# Patient Record
Sex: Female | Born: 1980 | Race: Black or African American | Hispanic: No | Marital: Single | Smoking: Never smoker
Health system: Southern US, Community
[De-identification: ages and names within clinical notes are randomized; demographics above are authoritative.]

## PROBLEM LIST (undated history)

## (undated) DIAGNOSIS — N83202 Unspecified ovarian cyst, left side: Secondary | ICD-10-CM

## (undated) DIAGNOSIS — B379 Candidiasis, unspecified: Secondary | ICD-10-CM

## (undated) DIAGNOSIS — Z975 Presence of (intrauterine) contraceptive device: Secondary | ICD-10-CM

## (undated) HISTORY — DX: Candidiasis, unspecified: B37.9

## (undated) HISTORY — DX: Presence of (intrauterine) contraceptive device: Z97.5

## (undated) HISTORY — DX: Unspecified ovarian cyst, left side: N83.202

---

## 2000-06-12 ENCOUNTER — Emergency Department (HOSPITAL_COMMUNITY): Admission: EM | Admit: 2000-06-12 | Discharge: 2000-06-12 | Payer: Self-pay | Admitting: Emergency Medicine

## 2000-06-12 ENCOUNTER — Encounter: Payer: Self-pay | Admitting: Emergency Medicine

## 2002-08-15 ENCOUNTER — Encounter: Payer: Self-pay | Admitting: Emergency Medicine

## 2002-08-15 ENCOUNTER — Emergency Department (HOSPITAL_COMMUNITY): Admission: EM | Admit: 2002-08-15 | Discharge: 2002-08-16 | Payer: Self-pay | Admitting: Emergency Medicine

## 2007-01-13 ENCOUNTER — Inpatient Hospital Stay (HOSPITAL_COMMUNITY): Admission: AD | Admit: 2007-01-13 | Discharge: 2007-01-13 | Payer: Self-pay | Admitting: Obstetrics and Gynecology

## 2007-01-20 ENCOUNTER — Inpatient Hospital Stay (HOSPITAL_COMMUNITY): Admission: AD | Admit: 2007-01-20 | Discharge: 2007-01-23 | Payer: Self-pay | Admitting: Obstetrics and Gynecology

## 2010-06-11 NOTE — H&P (Signed)
NAMEFELIX, PRATT                ACCOUNT NO.:  [id number]   MEDICAL RECORD NO.:  [id number]          PATIENT TYPE:  INP   LOCATION:  9173                          FACILITY:  [facility]   PHYSICIAN:  Osborn Coho, M.D.   DATE OF BIRTH:  05/04/1980   DATE OF ADMISSION:  01/20/2007  DATE OF DISCHARGE:                              HISTORY & PHYSICAL   This is a 30 year old, gravida 2, para 0-0-1-0, at 58 and 0/7th weeks,  who presented earlier for evaluation of labor and possible leakage.  She  had a Nitrazine positive in the office with the rest of her exam being  negative.  The cervix was 2 cm.  She has been followed by Dr. Normand Sloop  and remarkable for:  1)  Group B Strep negative; 2)  A left hemorrhagic  cyst; 3)  fetal EIF; 4)  Abnormal Glucola with normal three-hour GTT.   ALLERGIES:  None.   OBSTETRIC HISTORY:  An elective abortion in 1997 at [redacted] weeks gestation.   MEDICAL HISTORY:  Childhood varicella.   SURGICAL HISTORY:  A 20-week abortion in 1997.   FAMILY HISTORY:  A grandmother with hypertension and heart disease, a  grandfather with prostate cancer, and a grandfather with lung cancer.   GENETIC HISTORY:  Negative.   SOCIAL HISTORY:  The patient is single.  The father of the baby is  Renae Gloss __________ , who is involved and supportive.  She is of the  Christianity faith.  She denies any alcohol, tobacco, or drug use.   PRENATAL LABORATORY DATA:  Hemoglobin 12.3, platelets 304, blood type O  positive, antibody screen negative, sickle cell negative, RPR  nonreactive, Rubella immune, hepatitis negative, HIV negative, Pap test  normal, gonorrhea negative, Chlamydia negative, cystic fibrosis  negative.   HISTORY OF CURRENT PREGNANCY:  The patient entered care at seven weeks  gestation.  She had a normal first trimester screen.  She had an anatomy  scan at 18 weeks that was normal.  She had another ultrasound at 28  weeks showing the EIF was still present; otherwise, normal.   Glucola was  elevated at 30 weeks, but three-hour GTT was normal and Group B Strep  was negative at term.   OBJECTIVE:  VITAL SIGNS:  Stable, afebrile.  HEENT:  Within normal limits.  NECK:  Thyroid not enlarged.  CHEST:  Clear to auscultation.  HEART:  Regular rate and rhythm.  ABDOMEN:  Gravid, vertex on [place].  PELVIC EXAM:  Reactive fetal heart rate with several decelerations to  120s over the last several hours.  The cervix was initially 2 cm and is  now 3 to 4, 80%, and minus one with a vertex presentation.  Dr. Su Hilt  ruptured her membranes for clear fluid and an IUPC was placed.  Ultrasound showed a BPP of 8 out of 8 with an AFI of 8.1 and a posterior  placenta.  EXTREMITIES:  Within normal limits.   ASSESSMENT:  1. Intrauterine pregnancy at 40 weeks.  2. Early labor.  3. Intermittent decelerations with overall reassuring tracing.   PLAN:  1. Admit  per Dr. Su Hilt.  2. Routine MD orders.  3. May need Pitocin augmentation.      Marie L. Williams, C.N.M.      Osborn Coho, M.D.  Electronically Signed    MLW/MEDQ  D:  01/20/2007  T:  01/20/2007  Job:  [id number]

## 2010-11-01 LAB — CBC
Hemoglobin: 11.6 — ABNORMAL LOW
Hemoglobin: 9.8 — ABNORMAL LOW
MCHC: 34.1
MCHC: 34.2
MCV: 87.2
RDW: 13.6
RDW: 13.6

## 2010-11-01 LAB — RPR: RPR Ser Ql: NONREACTIVE

## 2011-01-02 ENCOUNTER — Ambulatory Visit: Payer: Worker's Compensation

## 2011-01-07 ENCOUNTER — Ambulatory Visit: Payer: Worker's Compensation

## 2011-03-03 ENCOUNTER — Telehealth: Payer: Self-pay

## 2011-03-03 NOTE — Telephone Encounter (Signed)
Patients would like a copy of her workers comp info ov notes, rtw notes and restrictions statements faxed her employer at [phone].

## 2011-03-05 ENCOUNTER — Other Ambulatory Visit: Payer: Self-pay | Admitting: Family Medicine

## 2011-03-06 NOTE — Telephone Encounter (Signed)
PT CALLED, WAS SEEN ON 02/28/11 FOR W/C.  LOOKING FOR VICODIN AND HEADACHE MEDS PLEASE PULL PER [facility] TL.

## 2011-03-06 NOTE — Telephone Encounter (Signed)
PLEASE PULL CHART AND PUT IN REFILL REQUESTS.  ALREADY HAS A MESSAGE TO PULL.  THANKS

## 2011-03-08 ENCOUNTER — Other Ambulatory Visit: Payer: Self-pay

## 2012-02-16 ENCOUNTER — Telehealth: Payer: Self-pay | Admitting: Obstetrics and Gynecology

## 2012-02-17 ENCOUNTER — Encounter: Payer: Self-pay | Admitting: Obstetrics and Gynecology

## 2012-02-17 ENCOUNTER — Other Ambulatory Visit: Payer: Self-pay | Admitting: Obstetrics and Gynecology

## 2012-02-17 ENCOUNTER — Ambulatory Visit: Payer: Self-pay | Admitting: Obstetrics and Gynecology

## 2012-02-17 VITALS — BP 118/70 | HR 78 | Temp 99.2°F | Ht 65.0 in | Wt 175.0 lb

## 2012-02-17 DIAGNOSIS — R102 Pelvic and perineal pain unspecified side: Secondary | ICD-10-CM

## 2012-02-17 DIAGNOSIS — R350 Frequency of micturition: Secondary | ICD-10-CM

## 2012-02-17 DIAGNOSIS — IMO0001 Reserved for inherently not codable concepts without codable children: Secondary | ICD-10-CM

## 2012-02-17 DIAGNOSIS — N926 Irregular menstruation, unspecified: Secondary | ICD-10-CM

## 2012-02-17 DIAGNOSIS — Z113 Encounter for screening for infections with a predominantly sexual mode of transmission: Secondary | ICD-10-CM

## 2012-02-17 DIAGNOSIS — N898 Other specified noninflammatory disorders of vagina: Secondary | ICD-10-CM

## 2012-02-17 LAB — POCT URINALYSIS DIPSTICK
Glucose, UA: NEGATIVE
Ketones, UA: NEGATIVE
Spec Grav, UA: 1.015
Urobilinogen, UA: NEGATIVE

## 2012-02-17 LAB — POCT URINE PREGNANCY: Preg Test, Ur: NEGATIVE

## 2012-02-17 MED ORDER — FLUCONAZOLE 150 MG PO TABS: 150.0000 mg | ORAL_TABLET | Freq: Once | ORAL | Status: AC

## 2012-02-17 MED ORDER — ESTRADIOL 1 MG PO TABS: 1.5000 mg | ORAL_TABLET | Freq: Every day | ORAL | Status: AC

## 2012-02-17 MED ORDER — METRONIDAZOLE 500 MG PO TABS: 500.0000 mg | ORAL_TABLET | Freq: Three times a day (TID) | ORAL | Status: AC

## 2012-02-17 MED ORDER — NAPROXEN SODIUM 550 MG PO TABS: ORAL_TABLET | ORAL | Status: DC

## 2012-02-17 NOTE — Telephone Encounter (Signed)
Tc to pt regarding msg.  Pt informed per med list, all 4 rx's were e-prescribed and went through successfully.  Pt states she will call pharmacy back again and check, will call back if they do not have it.

## 2012-02-17 NOTE — Progress Notes (Signed)
32 YO with Implanon since 09/20/2009 complains of bleeding x 2 weeks with pad change every 1-4 hours. Admits to clots and cramps rated at 6/10 on 10 point pain scale.  Takes Ibuprofen 400 mg every 6 hours that will decrease pain to 4/10. Admits to urinary frequency, moisture in underwear (without periods) accompanied by itching and odor.  Admits to some bleeding today but states that is hardly having cramps today.   PMH-normal menses 7 days with pad change every 6-8 hours with cramps 4/10.  O: Abdomen:soft, non-tender      Pelvic: EGBUS-wnl, vagina-moderate brown discharge, cervix-non-tender, uterus-ULNS, without tenderness, adnexae-no masses      or tenderness  Wet prep: ph-5.0, whiff-positive, many clue U/A-negative UPT-negative  A: Irregular Bleeding     BV     Implanon due to expire 08/2012     Dysmenorrhea  P: GC/CT-pending       Metronidazole 500 mg #14 bid x 7 days       Diflucan 150 mg #1  1 po stat 1       Estradiol 1.5  mg  #30 1 po qd no refills       Anaprox DS  #60 bid x 3 days then prn 3 refills       RTO-as scheduled or prn  Roseann Kees, PA-C  P:

## 2012-02-17 NOTE — Progress Notes (Signed)
When did bleeding start: 02/03/12 How  Long: 2 weeks How often changing pad/tampon: first week; every 4 hours and the last couple of days had to change every 1-2 hours. Bleeding Disorders: no Cramping: yes Contraception: yes Fibroids: no Hormone Therapy: no New Medications: no Menopausal Symptoms: hot flashes Vag. Discharge: yes  White discharge Abdominal Pain: no Increased Stress: no

## 2012-02-19 LAB — GC/CHLAMYDIA PROBE AMP: GC Probe RNA: NEGATIVE

## 2012-02-20 ENCOUNTER — Telehealth: Payer: Self-pay | Admitting: Obstetrics and Gynecology

## 2012-02-20 NOTE — Telephone Encounter (Signed)
Pt called regarding Triage Message. Left message for pt to call back.    [facility] CMA

## 2012-02-20 NOTE — Telephone Encounter (Signed)
Antibiotics given to pt on 1/21 is relieving her pain.

## 2012-02-23 ENCOUNTER — Telehealth: Payer: Self-pay | Admitting: Obstetrics and Gynecology

## 2012-02-23 NOTE — Telephone Encounter (Signed)
Lm on vm to cb per telephone call.  

## 2012-02-23 NOTE — Telephone Encounter (Signed)
Tc from pt per telephone call. Pt c/o vag d/c and itching. Pt took last dose of MTZ today and took Diflucan on day 1 of ATB's. Informed pt to call pharm for another rf of Diflucan. Pt voices understanding.

## 2012-02-25 ENCOUNTER — Ambulatory Visit: Payer: Self-pay | Admitting: Obstetrics and Gynecology

## 2012-12-22 ENCOUNTER — Other Ambulatory Visit: Payer: Self-pay | Admitting: Obstetrics and Gynecology

## 2012-12-22 DIAGNOSIS — Z3046 Encounter for surveillance of implantable subdermal contraceptive: Secondary | ICD-10-CM

## 2013-01-04 ENCOUNTER — Inpatient Hospital Stay
Admission: RE | Admit: 2013-01-04 | Discharge: 2013-01-04 | Disposition: A | Discharge status: Home / Self Care | Payer: Self-pay | Source: Ambulatory Visit | Attending: Obstetrics and Gynecology | Admitting: Obstetrics and Gynecology

## 2013-01-05 ENCOUNTER — Ambulatory Visit
Admission: RE | Admit: 2013-01-05 | Discharge: 2013-01-05 | Disposition: A | Discharge status: Home / Self Care | Payer: BC Managed Care – PPO | Source: Ambulatory Visit | Attending: Obstetrics and Gynecology | Admitting: Obstetrics and Gynecology

## 2013-01-05 DIAGNOSIS — Z3046 Encounter for surveillance of implantable subdermal contraceptive: Secondary | ICD-10-CM

## 2013-11-28 ENCOUNTER — Encounter: Payer: Self-pay | Admitting: Obstetrics and Gynecology

## 2015-05-28 IMAGING — US US EXTREM UP*L* LTD
1 series · 14 of 16 positions shown · non-contrast
Comparison: None

CLINICAL DATA: Scheduled for Implanon removal and needs imaging
localization.

EXAM:
LEFT UPPER EXTREMITY LIMITED SOFT TISSUE ULTRASOUND
TECHNIQUE: Ultrasound examination of the upper extremity soft tissues was
performed in the area of clinical concern.

[Series 1: us extrem up*left* ltd · 0.07mm/px · 16 acquisitions, 14 frames shown]
[im 1/16]
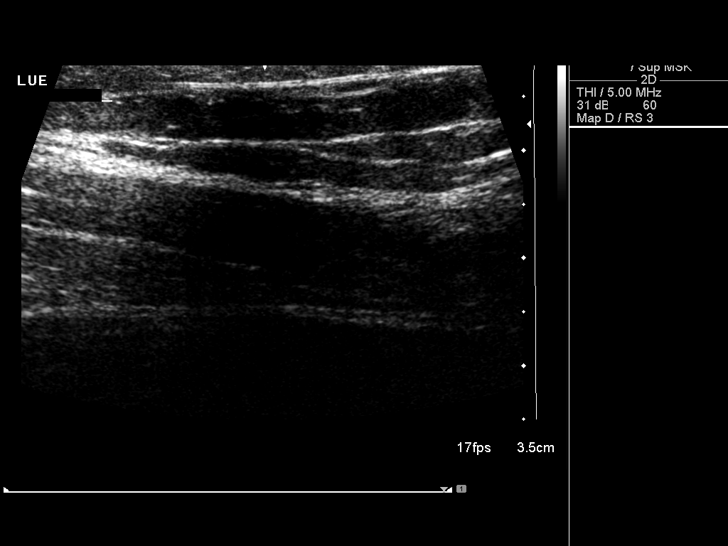
[im 2/16]
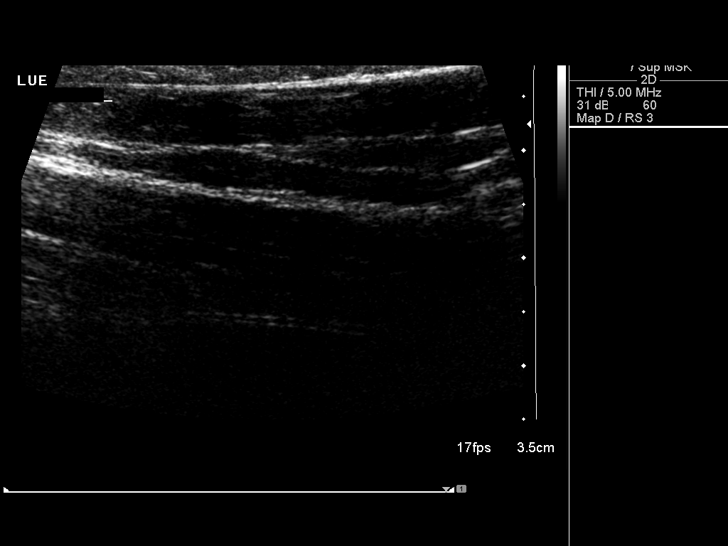
[im 3/16]
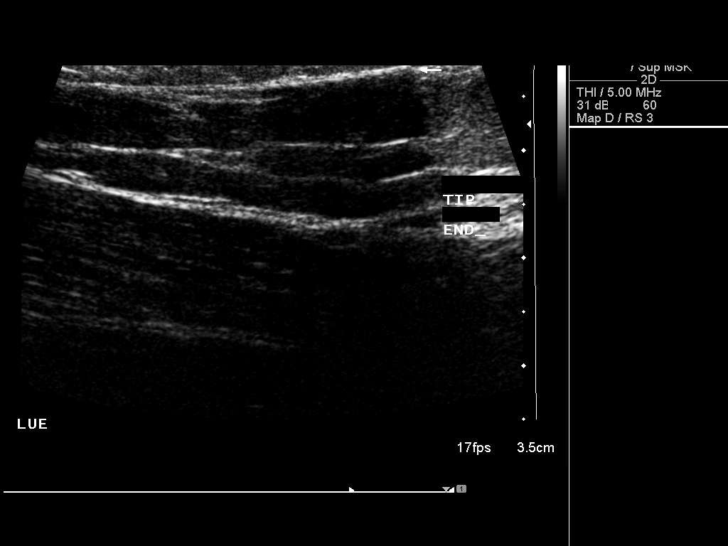
[im 5/16]
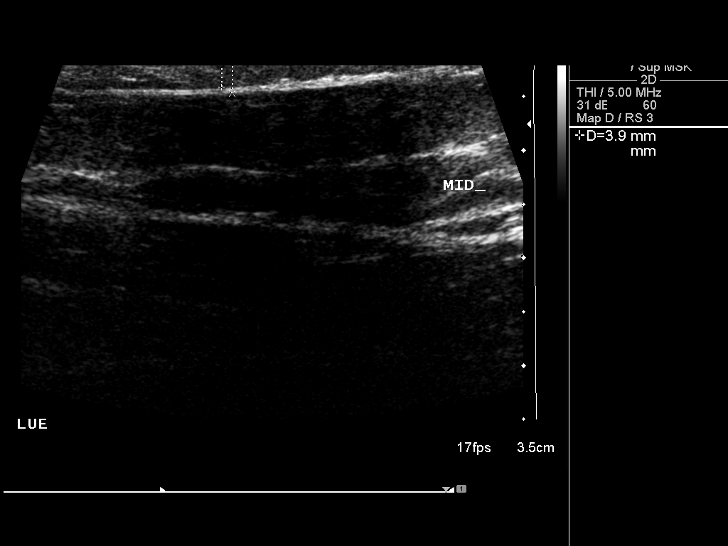
[im 6/16]
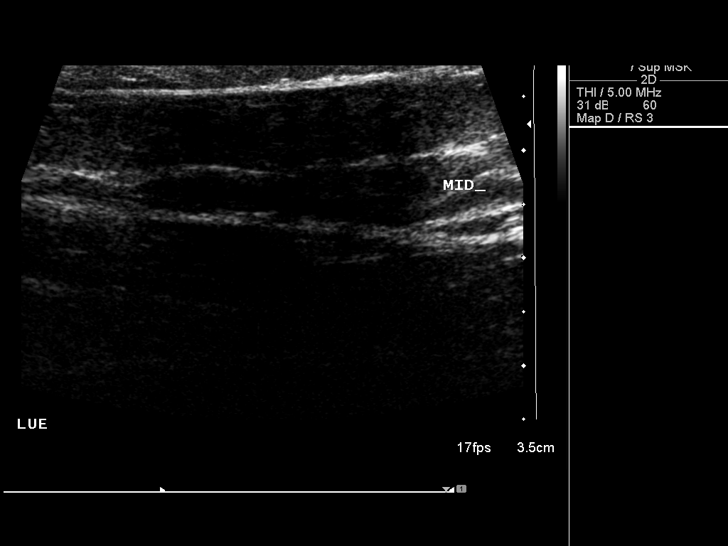
[im 7/16]
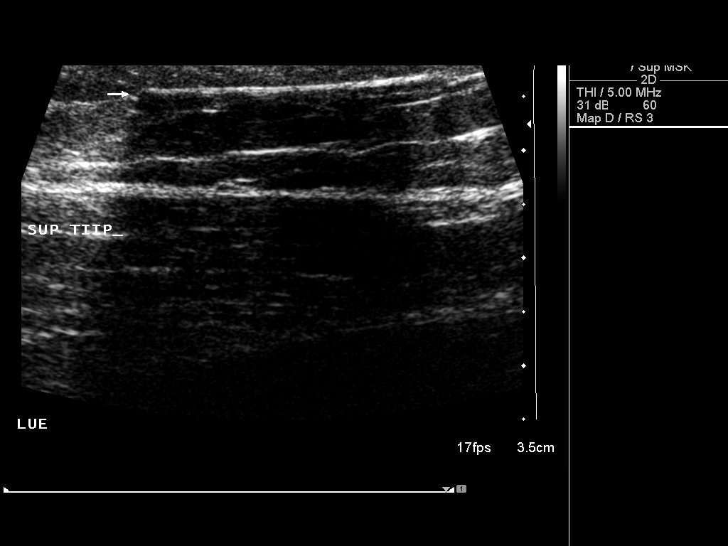
[im 8/16]
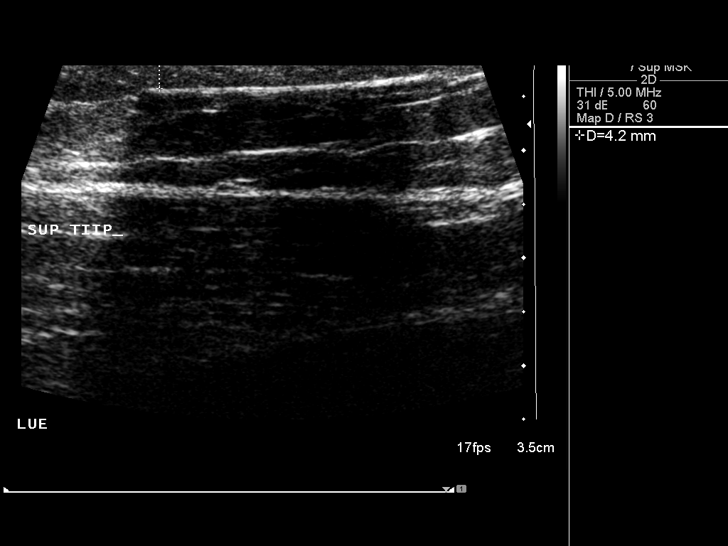
[im 9/16]
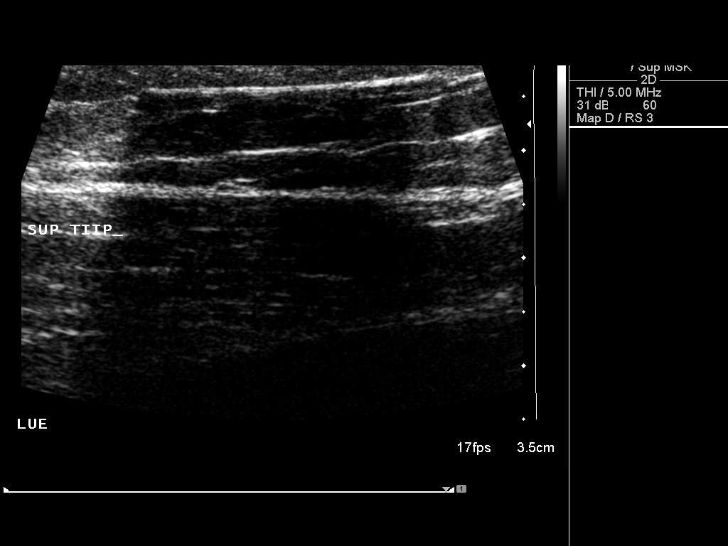
[im 10/16]
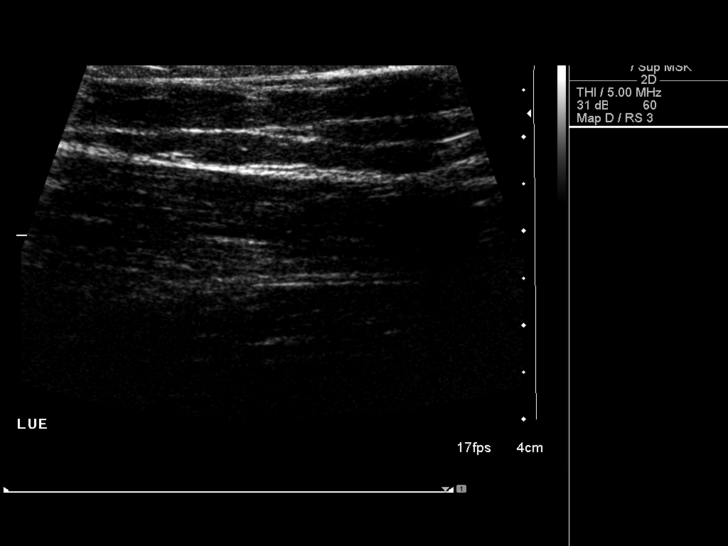
[im 11/16]
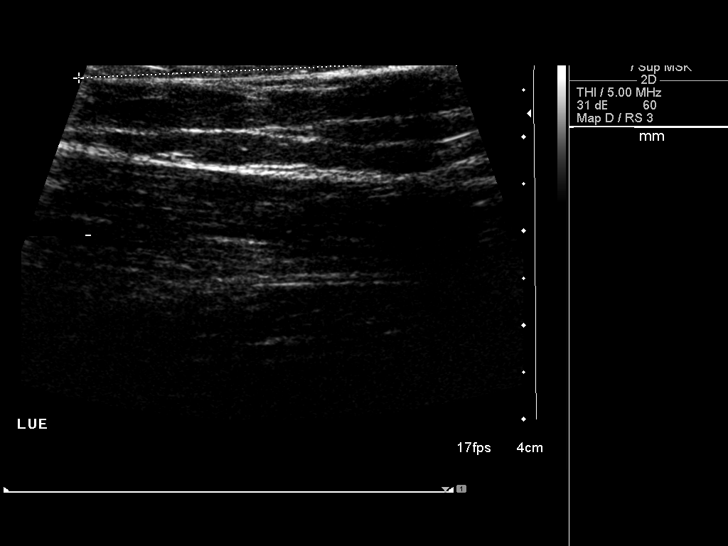
[im 13/16]
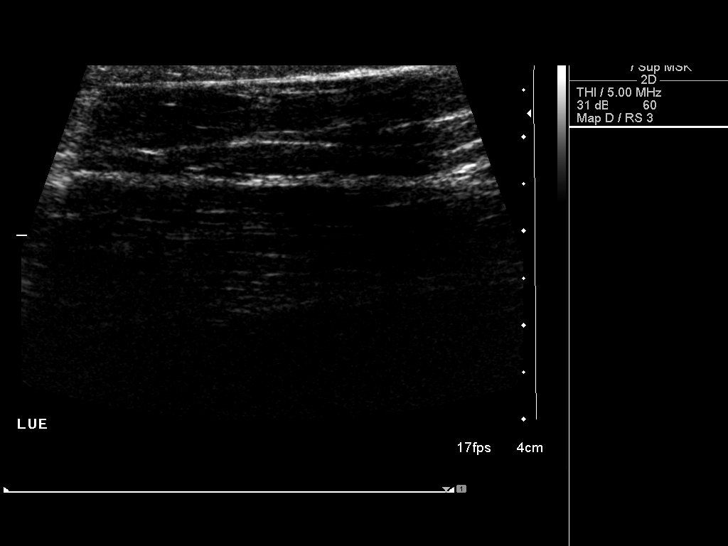
[im 14/16]
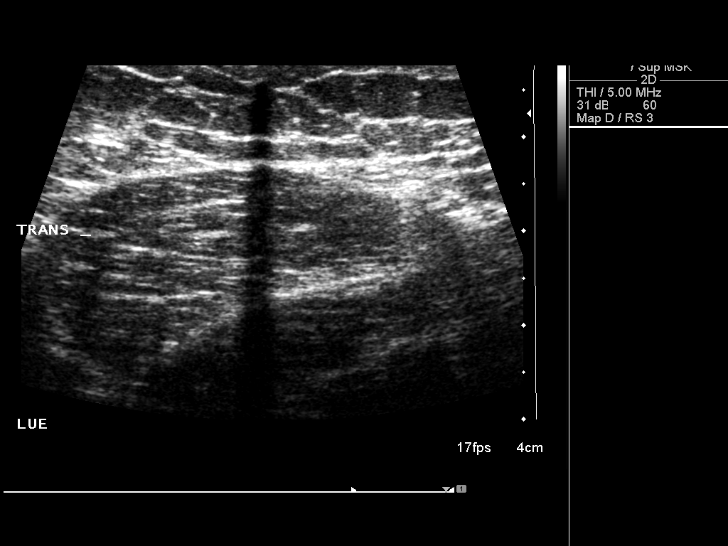
[im 15/16]
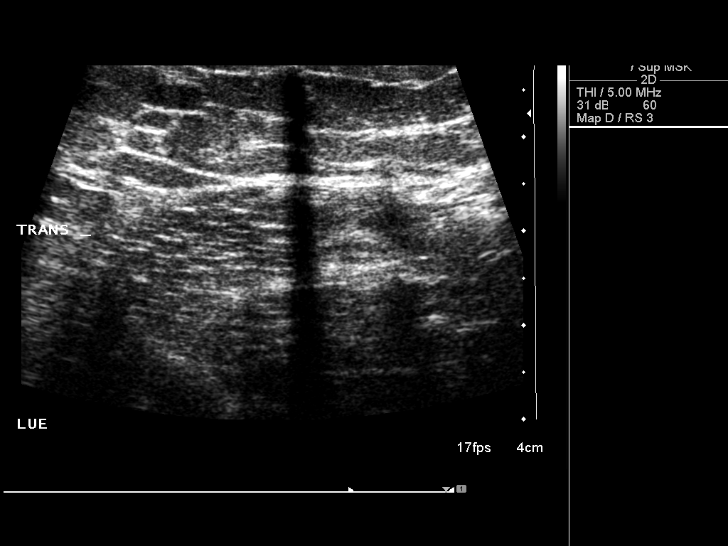
[im 16/16]
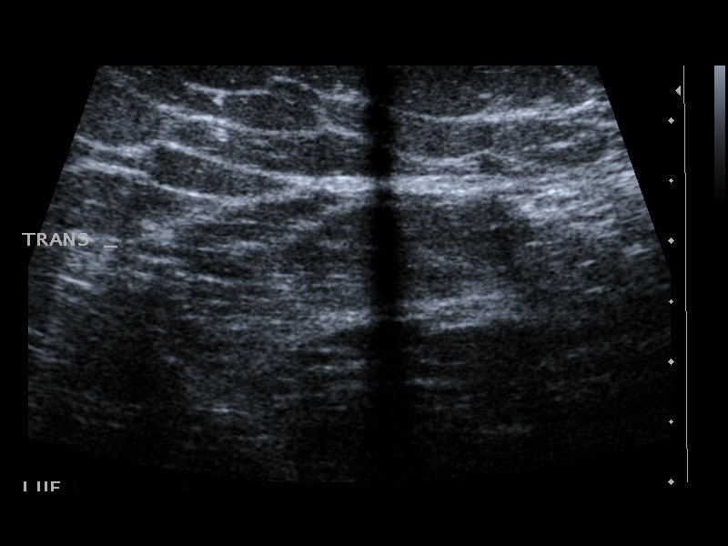

[14 of 16 positions shown; findings below may reference images not displayed]

FINDINGS: The implant is in the medial left upper arm. Implant roughly
measures 6 cm in the length on the skin surface. The inferior aspect
of the implant is 1.8 mm below the skin surface. The mid aspect of
the implant is 3.9 mm below the skin. The superior aspect of the
implant is 4.2 mm below the skin. The location of the implant was
marked on the skin.
IMPRESSION: Localization of the implant in the left upper arm.

## 2020-06-28 ENCOUNTER — Other Ambulatory Visit: Payer: Self-pay

## 2020-06-28 ENCOUNTER — Encounter: Payer: Self-pay | Admitting: Family Medicine

## 2020-06-28 ENCOUNTER — Ambulatory Visit: Payer: Self-pay

## 2020-06-28 ENCOUNTER — Ambulatory Visit: Payer: BC Managed Care – PPO | Admitting: Family Medicine

## 2020-06-28 VITALS — BP 100/60 | Ht 65.0 in | Wt 179.0 lb

## 2020-06-28 DIAGNOSIS — M25461 Effusion, right knee: Secondary | ICD-10-CM | POA: Diagnosis not present

## 2020-06-28 DIAGNOSIS — M25561 Pain in right knee: Secondary | ICD-10-CM

## 2020-06-28 MED ORDER — TRIAMCINOLONE ACETONIDE 40 MG/ML IJ SUSP
40.0000 mg | Freq: Once | INTRAMUSCULAR | Status: AC
  Administered 2020-06-28: 40 mg via INTRA_ARTICULAR

## 2020-06-28 NOTE — Progress Notes (Signed)
Katrina Steele - 40 y.o. female MRN [id number]  Date of birth: Aug 09, 1980  SUBJECTIVE:  Including CC & ROS.  No chief complaint on file.   Katrina Steele is a 40 y.o. female that is presenting with acute right knee pain.  She has had similar pain in the past.  Her knee feels swollen and pain medially.  No injury or inciting event.  Has received steroid injection a few months ago.  It only lasted for about a week.   Review of Systems See HPI   HISTORY: Past Medical, Surgical, Social, and Family History Reviewed & Updated per EMR.   Pertinent Historical Findings include:  Past Medical History:  Diagnosis Date  . Implanon in place   . Left ovarian cyst   . Yeast infection    h/o    History reviewed. No pertinent surgical history.  Family History  Problem Relation Age of Onset  . Cancer Paternal Grandfather        prostate  . Hypertension Maternal Grandmother   . Cancer Maternal Grandfather        lung  . Asthma Sister   . Hypertension Paternal Grandmother   . Heart disease Paternal Grandmother     Social History   Socioeconomic History  . Marital status: Single    Spouse name: Not on file  . Number of children: Not on file  . Years of education: Not on file  . Highest education level: Not on file  Occupational History  . Not on file  Tobacco Use  . Smoking status: Never Smoker  . Smokeless tobacco: Never Used  Substance and Sexual Activity  . Alcohol use: No  . Drug use: No  . Sexual activity: Yes    Birth control/protection: Implant    Comment: implanon;inserted 09/20/09  Other Topics Concern  . Not on file  Social History Narrative  . Not on file   Social Determinants of Health   Financial Resource Strain: Not on file  Food Insecurity: Not on file  Transportation Needs: Not on file  Physical Activity: Not on file  Stress: Not on file  Social Connections: Not on file  Intimate Partner Violence: Not on file     PHYSICAL EXAM:  VS: BP  100/60 (BP Location: Left Arm, Patient Position: Sitting, Cuff Size: Large)   Ht 5\' 5"  (1.651 m)   Wt 179 lb (81.2 kg)   BMI 29.79 kg/m  Physical Exam Gen: NAD, alert, cooperative with exam, well-appearing MSK:  Right knee: Moderate effusion. Limited range of motion. No instability. Neurovascular intact  Limited ultrasound: Right knee:  Moderate effusion with suprapatellar pouch. Normal-appearing quadricep and patellar tendon. Mild medial joint space changes and meniscus changes.  Increased hyperemia within the medial aspect. Lateral joint space with minor degenerative change lateral meniscus and increased hyperemia.  Summary: Moderate effusion and degenerative meniscal changes.  Ultrasound and interpretation by , MD   Aspiration/Injection Procedure Note Katrina Steele 01/21/1981  Procedure: Aspiration and Injection Indications: Right knee pain  Procedure Details Consent: Risks of procedure as well as the alternatives and risks of each were explained to the (patient/caregiver).  Consent for procedure obtained. Time Out: Verified patient identification, verified procedure, site/side was marked, verified correct patient position, special equipment/implants available, medications/allergies/relevent history reviewed, required imaging and test results available.  Performed.  The area was cleaned with iodine and alcohol swabs.    The right knee superior lateral suprapatellar pouch was injected using 4 cc of 1% lidocaine on a 25-gauge  1-1/2 inch needle.  An 18-gauge 1-1/2 inch needle was used to achieve aspiration.  The syringe was switched to mixture containing 1 cc's of 40 mg Kenalog and 4 cc's of 0.25% bupivacaine was injected.  Ultrasound was used. Images were obtained in long views showing the injection.    Amount of Fluid Aspirated: 92mL Character of Fluid: clear and straw colored Fluid was sent OHY:WVPX count and crystal identification  A sterile  dressing was applied.  Patient did tolerate procedure well.    ASSESSMENT & PLAN:   Effusion of bursa of right knee Has had a recurrent moderate effusion on exam.  Synovial fluid appeared normal but will evaluate.  Possibly related to underlying degenerative meniscal changes. -Counseled on home exercise therapy and supportive care. -Aspiration injection today. -Synovial fluid analysis. -Could consider further imaging or gel injection.

## 2020-06-28 NOTE — Assessment & Plan Note (Signed)
Has had a recurrent moderate effusion on exam.  Synovial fluid appeared normal but will evaluate.  Possibly related to underlying degenerative meniscal changes. -Counseled on home exercise therapy and supportive care. -Aspiration injection today. -Synovial fluid analysis. -Could consider further imaging or gel injection.

## 2020-06-28 NOTE — Patient Instructions (Signed)
Nice to meet you Please try the ice  Please try the exercise  I will call with the results   Please send me a message in MyChart with any questions or updates.  Please see me back in 4 weeks.   --Dr. Jordan Likes

## 2020-06-29 LAB — SYNOVIAL FLUID ANALYSIS, COMPLETE
Basophils, %: 0 %
Eosinophils-Synovial: 0 % (ref 0–2)
Lymphocytes-Synovial Fld: 35 % (ref 0–74)
Monocyte/Macrophage: 57 % (ref 0–69)
Neutrophil, Synovial: 8 % (ref 0–24)
Synoviocytes, %: 0 % (ref 0–15)
WBC, Synovial: 107 cells/uL (ref <150)

## 2020-07-02 ENCOUNTER — Telehealth: Payer: Self-pay | Admitting: Family Medicine

## 2020-07-02 NOTE — Telephone Encounter (Signed)
Left VM for patient. If she calls back please have her speak with a nurse/CMA and inform that his synovial fluid analysis is normal.   If any questions then please take the best time and phone number to call and I will try to call her back.   Myra Rude, MD [facility] 07/02/2020, 4:47 PM

## 2020-07-03 NOTE — Telephone Encounter (Signed)
Pt informed of below.  She still c/o swelling and feels like the fluid is slowly building back up. She wants to know what is causing the swelling. Please call her back.

## 2020-07-06 NOTE — Telephone Encounter (Signed)
Answered patient questions.   Myra Rude, MD [facility] 07/06/2020, 9:54 AM

## 2020-07-27 ENCOUNTER — Encounter: Payer: Self-pay | Admitting: Family Medicine

## 2020-07-27 ENCOUNTER — Other Ambulatory Visit: Payer: Self-pay

## 2020-07-27 ENCOUNTER — Ambulatory Visit: Payer: BC Managed Care – PPO | Admitting: Family Medicine

## 2020-07-27 VITALS — BP 94/60 | Ht 65.0 in | Wt 179.0 lb

## 2020-07-27 DIAGNOSIS — M1711 Unilateral primary osteoarthritis, right knee: Secondary | ICD-10-CM

## 2020-07-27 NOTE — Patient Instructions (Signed)
Good to see you Please use ice as needed  Please continue the exercises   Please send me a message in MyChart with any questions or updates.  We will inform you of when the gel injection is in .   --Dr. Jordan Likes

## 2020-07-27 NOTE — Assessment & Plan Note (Signed)
Has a component of degenerative changes likely playing a role. -Counseled on home exercise therapy and supportive care. -Pursue gel injection.

## 2020-07-27 NOTE — Progress Notes (Signed)
  Katrina Steele - 40 y.o. female MRN [id number]  Date of birth: 1980-07-11  SUBJECTIVE:  Including CC & ROS.  No chief complaint on file.   Katrina Steele is a 40 y.o. female that is following up for her right knee pain.  Has been doing quite well since the injection.  Has a minor twinge of pain from time to time.    Review of Systems See HPI   HISTORY: Past Medical, Surgical, Social, and Family History Reviewed & Updated per EMR.   Pertinent Historical Findings include:  Past Medical History:  Diagnosis Date   Implanon in place    Left ovarian cyst    Yeast infection    h/o    History reviewed. No pertinent surgical history.  Family History  Problem Relation Age of Onset   Cancer Paternal Grandfather        prostate   Hypertension Maternal Grandmother    Cancer Maternal Grandfather        lung   Asthma Sister    Hypertension Paternal Grandmother    Heart disease Paternal Grandmother     Social History   Socioeconomic History   Marital status: Single    Spouse name: Not on file   Number of children: Not on file   Years of education: Not on file   Highest education level: Not on file  Occupational History   Not on file  Tobacco Use   Smoking status: Never   Smokeless tobacco: Never  Substance and Sexual Activity   Alcohol use: No   Drug use: No   Sexual activity: Yes    Birth control/protection: Implant    Comment: implanon;inserted 09/20/09  Other Topics Concern   Not on file  Social History Narrative   Not on file   Social Determinants of Health   Financial Resource Strain: Not on file  Food Insecurity: Not on file  Transportation Needs: Not on file  Physical Activity: Not on file  Stress: Not on file  Social Connections: Not on file  Intimate Partner Violence: Not on file     PHYSICAL EXAM:  VS: BP 94/60 (BP Location: Left Arm, Patient Position: Sitting, Cuff Size: Normal)   Ht 5\' 5"  (1.651 m)   Wt 179 lb (81.2 kg)   BMI  29.79 kg/m  Physical Exam Gen: NAD, alert, cooperative with exam, well-appearing MSK:  Right knee: No effusion. Normal range of motion. Tenderness over the medial compartment. Neurovascular intact     ASSESSMENT & PLAN:   Primary osteoarthritis of right knee Has a component of degenerative changes likely playing a role. -Counseled on home exercise therapy and supportive care. -Pursue gel injection.

## 2020-08-15 ENCOUNTER — Telehealth: Payer: Self-pay | Admitting: *Deleted

## 2020-08-15 NOTE — Telephone Encounter (Signed)
Durolane ordered. Pt informed of below. OV scheduled 08/21/20.

## 2020-08-15 NOTE — Telephone Encounter (Signed)
Left message for patient to call back to inform pt of below:  Durolane and administration 2 are covered at 100% of allowable amount.  Deductibles do not apply to these services. Patient has a $80.0 copay whether, or not an office visit is billed. Only one copay applies per date of service. If out of pocket is met, copay will no longer apply.  Precert is approved via [place] from 08/14/20- 02/09/21.

## 2020-08-21 ENCOUNTER — Encounter: Payer: Self-pay | Admitting: Family Medicine

## 2020-08-21 ENCOUNTER — Ambulatory Visit: Payer: Self-pay

## 2020-08-21 ENCOUNTER — Other Ambulatory Visit: Payer: Self-pay

## 2020-08-21 ENCOUNTER — Ambulatory Visit: Payer: BC Managed Care – PPO | Admitting: Family Medicine

## 2020-08-21 VITALS — BP 118/80 | HR 86 | Ht 65.0 in

## 2020-08-21 DIAGNOSIS — M1711 Unilateral primary osteoarthritis, right knee: Secondary | ICD-10-CM | POA: Diagnosis not present

## 2020-08-21 NOTE — Progress Notes (Signed)
Katrina Steele - 40 y.o. female MRN [id number]  Date of birth: [birth year]  SUBJECTIVE:  Including CC & ROS.  Chief Complaint  Patient presents with   Follow-up    Right knee pain and swelling may need to drain fluid/ Left knee has also started to have aches and pain.     Katrina Steele is a 40 y.o. female that is  here for gel injection.   Review of Systems See HPI   HISTORY: Past Medical, Surgical, Social, and Family History Reviewed & Updated per EMR.   Pertinent Historical Findings include:  Past Medical History:  Diagnosis Date   Implanon in place    Left ovarian cyst    Yeast infection    h/o    History reviewed. No pertinent surgical history.  Family History  Problem Relation Age of Onset   Cancer Paternal Grandfather        prostate   Hypertension Maternal Grandmother    Cancer Maternal Grandfather        lung   Asthma Sister    Hypertension Paternal Grandmother    Heart disease Paternal Grandmother     Social History   Socioeconomic History   Marital status: Single    Spouse name: Not on file   Number of children: Not on file   Years of education: Not on file   Highest education level: Not on file  Occupational History   Not on file  Tobacco Use   Smoking status: Never   Smokeless tobacco: Never  Substance and Sexual Activity   Alcohol use: No   Drug use: No   Sexual activity: Yes    Birth control/protection: Implant    Comment: implanon;inserted 09/20/09  Other Topics Concern   Not on file  Social History Narrative   Not on file   Social Determinants of Health   Financial Resource Strain: Not on file  Food Insecurity: Not on file  Transportation Needs: Not on file  Physical Activity: Not on file  Stress: Not on file  Social Connections: Not on file  Intimate Partner Violence: Not on file     PHYSICAL EXAM:  VS: BP 118/80   Pulse 86   Ht 5\' 5"  (1.651 m)   SpO2 97%   BMI 29.79 kg/m  Physical Exam Gen: NAD, alert,  cooperative with exam, well-appearing    Aspiration/Injection Procedure Note [name] [birth year]  Procedure: Aspiration and injection  Indications: right knee pain   Procedure Details Consent: Risks of procedure as well as the alternatives and risks of each were explained to the (patient/caregiver).  Consent for procedure obtained. Time Out: Verified patient identification, verified procedure, site/side was marked, verified correct patient position, special equipment/implants available, medications/allergies/relevent history reviewed, required imaging and test results available.  Performed.  The area was cleaned with iodine and alcohol swabs.    The right knee superior lateral suprapatellar pouch was injected using 4 cc's of 1% lidocaine with a 25 1 1/2" needle.  An 18-gauge 1-1/2 inch needle was used to achieve aspiration.  The syringe was switched and a 60 mg/ 3 mL of Durolane was injected. Ultrasound was used. Images were obtained in  Long views showing the injection.    Amount of Fluid Aspirated:  35mL Character of Fluid: clear and straw colored Fluid was sent for: n/a A sterile dressing was applied.  Patient did tolerate procedure well.     ASSESSMENT & PLAN:   Primary osteoarthritis of right knee Completed Durolane injection today  -  could consider PT or further imaging.  - consider PRP or zilretta

## 2020-08-21 NOTE — Assessment & Plan Note (Signed)
Completed Durolane injection today  - could consider PT or further imaging.  - consider PRP or zilretta

## 2020-08-21 NOTE — Patient Instructions (Signed)
Good to see you Please try ice as needed   Please send me a message in MyChart with any questions or updates.  Please see me back in 4 weeks.   --Dr. Omeka Holben  

## 2020-08-22 ENCOUNTER — Telehealth: Payer: Self-pay | Admitting: Family Medicine

## 2020-08-22 NOTE — Telephone Encounter (Signed)
Counseled on her swollen knee. Will watch for now and she'll call back.   Myra Rude, MD [facility] 08/22/2020, 12:15 PM

## 2020-09-11 ENCOUNTER — Ambulatory Visit: Payer: BC Managed Care – PPO | Admitting: Family Medicine

## 2020-09-11 ENCOUNTER — Encounter: Payer: Self-pay | Admitting: Family Medicine

## 2020-09-11 ENCOUNTER — Other Ambulatory Visit: Payer: Self-pay

## 2020-09-11 VITALS — BP 122/82 | Ht 65.0 in | Wt 179.0 lb

## 2020-09-11 DIAGNOSIS — M1711 Unilateral primary osteoarthritis, right knee: Secondary | ICD-10-CM

## 2020-09-11 MED ORDER — MELOXICAM 7.5 MG PO TABS: 7.5000 mg | ORAL_TABLET | Freq: Two times a day (BID) | ORAL | 1 refills | Status: DC | PRN

## 2020-09-11 NOTE — Patient Instructions (Signed)
Good to see you Please try heat  Please try the mobic  I will call with the xray results.  Please call [phone] to schedule the MRI   Please send me a message in MyChart with any questions or updates.  We will schedule a virtual visit once the MRI is resulted.   --Dr. Jordan Likes

## 2020-09-11 NOTE — Progress Notes (Signed)
  Katrina Steele - 40 y.o. female MRN [id number]  Date of birth: 23-May-1980  SUBJECTIVE:  Including CC & ROS.  No chief complaint on file.   Katrina Steele is a 40 y.o. female that is following up for her right knee pain.  She has tried injection and gel injection and pain is still ongoing.  Pain has been present since March.  Still having pain over the medial compartment.   Review of Systems See HPI   HISTORY: Past Medical, Surgical, Social, and Family History Reviewed & Updated per EMR.   Pertinent Historical Findings include:  Past Medical History:  Diagnosis Date   Implanon in place    Left ovarian cyst    Yeast infection    h/o    History reviewed. No pertinent surgical history.  Family History  Problem Relation Age of Onset   Cancer Paternal Grandfather        prostate   Hypertension Maternal Grandmother    Cancer Maternal Grandfather        lung   Asthma Sister    Hypertension Paternal Grandmother    Heart disease Paternal Grandmother     Social History   Socioeconomic History   Marital status: Single    Spouse name: Not on file   Number of children: Not on file   Years of education: Not on file   Highest education level: Not on file  Occupational History   Not on file  Tobacco Use   Smoking status: Never   Smokeless tobacco: Never  Substance and Sexual Activity   Alcohol use: No   Drug use: No   Sexual activity: Yes    Birth control/protection: Implant    Comment: implanon;inserted 09/20/09  Other Topics Concern   Not on file  Social History Narrative   Not on file   Social Determinants of Health   Financial Resource Strain: Not on file  Food Insecurity: Not on file  Transportation Needs: Not on file  Physical Activity: Not on file  Stress: Not on file  Social Connections: Not on file  Intimate Partner Violence: Not on file     PHYSICAL EXAM:  VS: BP 122/82 (BP Location: Left Arm, Patient Position: Sitting, Cuff Size: Large)    Ht 5\' 5"  (1.651 m)   Wt 179 lb (81.2 kg)   BMI 29.79 kg/m  Physical Exam Gen: NAD, alert, cooperative with exam, well-appearing MSK:  Right knee: Mild effusion. Normal range of motion. Positive McMurray's test. Neurovascular intact     ASSESSMENT & PLAN:   Primary osteoarthritis of right knee Pain is been acute on chronic in nature.  Present before March.  Has had repeated effusions.  Unsuccessful with steroid injection and gel injection.  Has had over 6 weeks of home exercise therapy.  Previous imaging from March on x-ray was unrevealing. -Counseled on home exercise therapy and supportive care. -Mobic. -MRI to evaluate for internal derangement.

## 2020-09-12 ENCOUNTER — Encounter: Payer: Self-pay | Admitting: *Deleted

## 2020-09-12 NOTE — Assessment & Plan Note (Signed)
Pain is been acute on chronic in nature.  Present before March.  Has had repeated effusions.  Unsuccessful with steroid injection and gel injection.  Has had over 6 weeks of home exercise therapy.  Previous imaging from March on x-ray was unrevealing. -Counseled on home exercise therapy and supportive care. -Mobic. -MRI to evaluate for internal derangement.

## 2020-09-18 ENCOUNTER — Ambulatory Visit: Payer: BC Managed Care – PPO | Admitting: Family Medicine

## 2020-09-20 ENCOUNTER — Encounter: Payer: Self-pay | Admitting: Family Medicine

## 2020-09-21 ENCOUNTER — Ambulatory Visit (HOSPITAL_BASED_OUTPATIENT_CLINIC_OR_DEPARTMENT_OTHER): Payer: BC Managed Care – PPO | Attending: Family Medicine | Admitting: Physical Therapy

## 2020-09-21 ENCOUNTER — Other Ambulatory Visit: Payer: Self-pay

## 2020-09-21 ENCOUNTER — Encounter (HOSPITAL_BASED_OUTPATIENT_CLINIC_OR_DEPARTMENT_OTHER): Payer: Self-pay | Admitting: Physical Therapy

## 2020-09-21 DIAGNOSIS — R262 Difficulty in walking, not elsewhere classified: Secondary | ICD-10-CM | POA: Insufficient documentation

## 2020-09-21 DIAGNOSIS — M25661 Stiffness of right knee, not elsewhere classified: Secondary | ICD-10-CM | POA: Insufficient documentation

## 2020-09-21 DIAGNOSIS — M25561 Pain in right knee: Secondary | ICD-10-CM | POA: Diagnosis present

## 2020-09-21 DIAGNOSIS — M6281 Muscle weakness (generalized): Secondary | ICD-10-CM | POA: Diagnosis present

## 2020-09-21 NOTE — Therapy (Addendum)
[facility] [address] [place], [place], [address] Phone: [phone]   Fax:  [phone]  Physical Therapy Evaluation  Patient Details  Name: Katrina Steele MRN: [id number] Date of Birth: 1980/12/28 Referring Provider (PT): Clare Gandy, MD   Encounter Date: 09/21/2020   PT End of Session - 09/21/20 1328     Visit Number 1    Number of Visits 7    Date for PT Re-Evaluation 11/02/20    Progress Note Due on Visit --   10/19/2020   PT Start Time 1322    PT Stop Time 1420    PT Time Calculation (min) 58 min    Activity Tolerance Patient tolerated treatment well    Behavior During Therapy [facility] for tasks assessed/performed             Past Medical History:  Diagnosis Date   Implanon in place    Left ovarian cyst    Yeast infection    h/o    History reviewed. No pertinent surgical history.  There were no vitals filed for this visit.    Subjective Assessment - 09/21/20 1332     Subjective Pt reports her pain began in February and her sx's worsened in March interfering with her mobility.  Pt denies any specific MOI.  Pt saw MD and has received a cortisone shot and gel injection.  Pt reports improved sx's for 1 month from the cortisone shot and the gel injection helped some including improved swelling.  Pt has had an [place] and x ray.  pt is taking Meloxicam and reports no improvement.  Pt states she thinks she has been taking it wrong, only taking it once per day.  Pt has had knee aspirated twice.  Pt has stiffness at night.  Pt has pain with ambulation and walks with a limp.  Pt has pain at work with standing and walking  Pt has difficulty and pain with performing stairs and performs 1 step at a time.  MRI scheduled on 10/09/20.  pt has pain lying flat and difficulty sleeping.    Limitations Walking;Standing    How long can you stand comfortably? 5 mins    How long can you walk comfortably? 10-15 mins    Diagnostic  tests X ray:  small jt effusion, mild medial and patellofemoral compartment deg changes.  [place]:  (limited)  mild medial jt space changes and meniscus changes.  lateral jt space with minor deg changes lateral meniscus.  moderate effuction with suprapatellar pouch.    Patient Stated Goals to improve pain.  return to walking program    Currently in Pain? Yes    Pain Score 5    Worst: 7/10  Best:  0/10   Aggravating Factors  standing, walking, stairs    Pain Relieving Factors rest, heat, heated pool                [facility] PT Assessment - 09/21/20 0001       Assessment   Medical Diagnosis Primary OA of R knee    Referring Provider (PT) Clare Gandy, MD    Onset Date/Surgical Date --   Feb-March 2022   Hand Dominance Right    Next MD Visit --   virtual visit after MRI   Prior Therapy none for knee.      Precautions   Precautions None      Restrictions   Weight Bearing Restrictions No      Balance Screen   Has  the patient fallen in the past 6 months No      Home Environment   Living Environment Private residence    Additional Comments 2 story home      Prior Function   Level of Independence Independent   Pt was able to perform her normal functional mobility skills and work activities without increased knee pain.   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      Cognition   Overall Cognitive Status Within Functional Limits for tasks assessed      Observation/Other Assessments   Other Surveys  Lower Extremity Functional Scale    Lower Extremity Functional Scale  29/80      ROM / Strength   AROM / PROM / Strength AROM;Strength;PROM      AROM   AROM Assessment Site Knee    Right/Left Knee Left;Right    Right Knee Extension 1    Right Knee Flexion 96    Left Knee Extension --   2 deg of hyperextension   Left Knee Flexion 129      PROM   PROM Assessment Site Knee    Right/Left Knee Right     Right Knee Extension 0      Strength   Strength Assessment Site Knee;Hip    Right/Left Hip Right;Left    Right Hip Flexion 4/5    Right Hip Extension 4+/5    Right Hip ABduction 5/5    Right Hip ADduction 5/5    Left Hip Flexion 5/5    Left Hip Extension 5/5    Left Hip ABduction 5/5    Left Hip ADduction 5/5    Right/Left Knee Right;Left    Right Knee Flexion 4/5    Right Knee Extension 4-/5    Left Knee Flexion 4+/5    Left Knee Extension 5/5      Palpation   Palpation comment TTP:  lateral R knee      Ambulation/Gait   Gait Comments antalgic limp.  reduced WB'ing thru R LE with increased stance time on L.  decreased TKE on R.                        Objective measurements completed on examination: See above findings.       [facility] Adult PT Treatment/Exercise - 09/21/20 0001       Exercises   Exercises Knee/Hip      Knee/Hip Exercises: Stretches   Other Knee/Hip Stretches seated HS stretch 2x20-30 seconds      Knee/Hip Exercises: Supine   Quad Sets Limitations x10 reps with 5 seconds in longsitting    Heel Slides Limitations x10 reps                    PT Education - 09/21/20 1421     Education Details Educated pt concerning dx, relevant anatomy, and POC.  Pt received a HEP handout and was educated in correct form and appropriate frequency.  Access Code: [email or web address]  URL: [email or web address]  Date: 09/21/2020  Prepared by: Aaron Edelmanrey Eugena Rhue    Exercises  Supine Quadricep Sets - 2 x daily - 7 x weekly - 2 sets - 10 reps - 5 seconds hold  Supine Heel Slide - 2 x daily - 7 x weekly - 2 sets - 10 reps  Seated Hamstring Stretch - 2 x daily - 7 x weekly -  1 sets - 2-3 reps - 20-30 seconds hold    Person(s) Educated Patient    Methods Explanation;Demonstration;Verbal cues;Handout    Comprehension Verbalized understanding;Returned demonstration              PT Short Term Goals - 09/21/20 1932       PT SHORT TERM GOAL #1   Title  Pt will be independent and compliant with HEP for improved pain, ROM, strength, and function.    Time 3    Period Weeks    Status New    Target Date 10/12/20      PT SHORT TERM GOAL #2   Title Pt will demo improved R knee AROM to 0 - 106 deg for improved stiffness and gait.    Time 3    Period Weeks    Status New    Target Date 10/12/20      PT SHORT TERM GOAL #3   Title Pt will demo improved TKE, increased WB'ing thru R LE, and reduced limp with gait    Time 3    Period Weeks    Status New    Target Date 10/12/20      PT SHORT TERM GOAL #4   Title Pt will report at least a 25% improvement in pain with mobility.    Time 3    Period Weeks    Status New    Target Date 10/12/20               PT Long Term Goals - 09/21/20 1935       PT LONG TERM GOAL #1   Title Pt will demo R knee AROM to 0 - 115 deg for improved stiffness, mobility, and peformance of stairs.    Time 6    Period Weeks    Status New    Target Date 11/02/20      PT LONG TERM GOAL #2   Title Pt will be able to perform stairs with a reciprocal gait with 1 rail without significant pain.    Time 6    Period Weeks    Status New    Target Date 11/02/20      PT LONG TERM GOAL #3   Title Pt will ambulate extended community distance without significant pain or limitation.    Time 6    Period Weeks    Status New    Target Date 11/02/20      PT LONG TERM GOAL #4   Title Pt will report she is performing her normal occupational activities without significant pain or difficulty.    Time 6    Period Weeks    Status New    Target Date 11/02/20      PT LONG TERM GOAL #5   Title Pt will demo improved R hip and knee strength to 5/5 MMT for improved tolerance and peformance of her normal functional mobility skills and work activities.    Time 6    Period Weeks    Status New    Target Date 11/02/20                    Plan - 09/21/20 1329     Clinical Impression Statement Pt is a 40 y/o  female with a dx of primary OA in R knee presenting to the clinic with R knee pain, limited R knee ROM, muscle weakness in R LE, and difficulty in walking.  Pt received a cortisone shot and gel  injection and reports improved sx's for 1 month from the cortisone shot and some improved swelling and pain from the gel injection.  Pt has had knee aspirated twice.  Pt has had an [place] and x ray and is getting a MRI soon.  Pt has R knee pain with ambulation and standing.  Pt has pain lying flat and difficulty with sleeping.  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  She has difficulty and pain with performing stairs and performs 1 step at a time.  Pt ambulates with an antalgic limp with reduced WB'ing in R LE and decreased TKE.  Pt has limited knee flexion ROM and slight limitations with knee extension ROM.  She has strength deficitis in R hip and knee.  Pt should benefit from skilled PT services.    Examination-Activity Limitations Squat;Stairs;Locomotion Level;Stand    Examination-Participation Restrictions Occupation;Community Activity    Stability/Clinical Decision Making Stable/Uncomplicated    Clinical Decision Making Low    Rehab Potential Good    PT Frequency 1x / week    PT Duration 6 weeks    PT Treatment/Interventions ADLs/Self Care Home Management;Aquatic Therapy;Cryotherapy;Electrical Stimulation;Gait training;Stair training;Functional mobility training;Therapeutic activities;Therapeutic exercise;Balance training;Neuromuscular re-education;Manual techniques;Patient/family education;Passive range of motion;Dry needling;Taping    PT Next Visit Plan Pt has a high copay.  Pt to come 1x/wk.  review and perform HEP.  progress hip and knee strengthening.  knee ROM, jt mobs, quad stretching.    PT Home Exercise Plan Access Code: [email or web address]  URL: [email or web address]  Date: 09/21/2020  Prepared by: Aaron Edelman    Exercises  Supine Quadricep Sets - 2 x daily - 7 x weekly - 2 sets - 10 reps  - 5 seconds hold  Supine Heel Slide - 2 x daily - 7 x weekly - 2 sets - 10 reps  Seated Hamstring Stretch - 2 x daily - 7 x weekly - 1 sets - 2-3 reps - 20-30 seconds hold    Consulted and Agree with Plan of Care Patient             Patient will benefit from skilled therapeutic intervention in order to improve the following deficits and impairments:  Difficulty walking, Abnormal gait, Decreased activity tolerance, Decreased mobility, Decreased range of motion, Decreased strength, Hypomobility, Impaired flexibility, Pain  Visit Diagnosis: Right knee pain, unspecified chronicity  Stiffness of right knee, not elsewhere classified  Muscle weakness (generalized)  Difficulty in walking, not elsewhere classified     Problem List Patient Active Problem List   Diagnosis Date Noted   Primary osteoarthritis of right knee 07/27/2020   Effusion of bursa of right knee 06/28/2020    Audie Clear III PT, DPT 09/21/20 7:40 PM  PHYSICAL THERAPY DISCHARGE SUMMARY  Visits from Start of Care: 1  Current functional level related to goals / functional outcomes: Unable to assess current functional status and goals due to pt not being present at discharge.     Remaining deficits: See above   Education / Equipment: See above   Pt was evaluated on 09/21/2020 and did not schedule any further PT.  Pt did have a high co-pay.  Pt to be discharged from skilled PT services due To not scheduling any further PT.  Pt has a HEP.   Audie Clear III PT, DPT 02/02/21 10:38 AM     [facility] [address] [place], [place], [address] Phone: [phone]   Fax:  [phone]  Name: Katrina Steele MRN: [id number]  Date of Birth: Jun 07, 1980

## 2020-09-23 ENCOUNTER — Other Ambulatory Visit: Payer: BC Managed Care – PPO

## 2020-09-26 ENCOUNTER — Telehealth: Payer: Self-pay | Admitting: *Deleted

## 2020-09-26 NOTE — Telephone Encounter (Signed)
CPT [id number], MRI Right knee is approved. Order/auth # is [id number] valid 09/17/20-10/16/20.

## 2020-10-03 ENCOUNTER — Telehealth: Payer: Self-pay | Admitting: Family Medicine

## 2020-10-03 DIAGNOSIS — M1711 Unilateral primary osteoarthritis, right knee: Secondary | ICD-10-CM

## 2020-10-03 NOTE — Telephone Encounter (Signed)
Patient left msg on office [facility] asking Dr. Jordan Likes for recommendation of diff PT provider, she has scheduling conflicts of the available times  & current PT provider can't accommodate.  --Forwarding msg to med asst for review w/Dr.Schmitz   --glh

## 2020-10-06 ENCOUNTER — Ambulatory Visit: Payer: BC Managed Care – PPO

## 2020-10-06 ENCOUNTER — Other Ambulatory Visit: Payer: Self-pay

## 2020-11-26 ENCOUNTER — Other Ambulatory Visit: Payer: Self-pay | Admitting: *Deleted

## 2020-11-26 DIAGNOSIS — M1711 Unilateral primary osteoarthritis, right knee: Secondary | ICD-10-CM

## 2020-11-26 MED ORDER — MELOXICAM 7.5 MG PO TABS
7.5000 mg | ORAL_TABLET | Freq: Two times a day (BID) | ORAL | 1 refills | Status: DC | PRN
Start: 2020-11-26 — End: 2021-04-29

## 2021-04-26 ENCOUNTER — Encounter: Payer: Self-pay | Admitting: Family Medicine

## 2021-04-29 ENCOUNTER — Other Ambulatory Visit: Payer: Self-pay | Admitting: Family Medicine

## 2021-04-29 DIAGNOSIS — M1711 Unilateral primary osteoarthritis, right knee: Secondary | ICD-10-CM

## 2021-04-29 MED ORDER — MELOXICAM 7.5 MG PO TABS: 7.5000 mg | ORAL_TABLET | Freq: Every day | ORAL | 1 refills | Status: DC

## 2022-05-12 ENCOUNTER — Encounter: Payer: Self-pay | Admitting: *Deleted

## 2022-05-15 ENCOUNTER — Ambulatory Visit: Payer: BC Managed Care – PPO | Admitting: Family Medicine

## 2022-06-04 ENCOUNTER — Encounter: Payer: Self-pay | Admitting: Family Medicine

## 2022-06-04 ENCOUNTER — Ambulatory Visit (INDEPENDENT_AMBULATORY_CARE_PROVIDER_SITE_OTHER): Payer: BC Managed Care – PPO | Admitting: Family Medicine

## 2022-06-04 VITALS — BP 102/78 | Ht 65.0 in | Wt 179.0 lb

## 2022-06-04 DIAGNOSIS — M1711 Unilateral primary osteoarthritis, right knee: Secondary | ICD-10-CM | POA: Diagnosis not present

## 2022-06-04 NOTE — Progress Notes (Signed)
  Katrina Steele - 42 y.o. female MRN [id number]  Date of birth: 06/26/1980  SUBJECTIVE:  Including CC & ROS.  No chief complaint on file.   Katrina Steele is a 42 y.o. female that is following up for her right knee pain.  She has been doing well and started back to exercise.  Denies any irritation currently.    Review of Systems See HPI   HISTORY: Past Medical, Surgical, Social, and Family History Reviewed & Updated per EMR.   Pertinent Historical Findings include:  Past Medical History:  Diagnosis Date   Implanon in place    Left ovarian cyst    Yeast infection    h/o    History reviewed. No pertinent surgical history.   PHYSICAL EXAM:  VS: BP 102/78 (BP Location: Left Arm, Patient Position: Sitting)   Ht 5\' 5"  (1.651 m)   Wt 179 lb (81.2 kg)   BMI 29.79 kg/m  Physical Exam Gen: NAD, alert, cooperative with exam, well-appearing MSK:  Neurovascularly intact       ASSESSMENT & PLAN:   Primary osteoarthritis of right knee Doing well with minimal pain.  She is able to get back to exercise. -Counseled on home exercise therapy and supportive care. -Green sport insoles. -Could consider custom orthotics further imaging or physical therapy

## 2022-06-04 NOTE — Patient Instructions (Signed)
Good to see you Please use ice as needed  Please continue the exercises   Please send me a message in MyChart with any questions or updates.  Please see me back as needed.   --Dr. Primus Gritton  

## 2022-06-04 NOTE — Assessment & Plan Note (Signed)
Doing well with minimal pain.  She is able to get back to exercise. -Counseled on home exercise therapy and supportive care. -Green sport insoles. -Could consider custom orthotics further imaging or physical therapy

## 2022-06-05 ENCOUNTER — Other Ambulatory Visit: Payer: Self-pay | Admitting: Family Medicine

## 2022-06-05 DIAGNOSIS — M1711 Unilateral primary osteoarthritis, right knee: Secondary | ICD-10-CM

## 2022-06-05 MED ORDER — MELOXICAM 7.5 MG PO TABS: 7.5000 mg | ORAL_TABLET | Freq: Every day | ORAL | 1 refills | Status: DC

## 2022-06-05 NOTE — Addendum Note (Signed)
Addended by: Merrilyn Puma on: 06/05/2022 10:55 AM   Modules accepted: Orders

## 2022-10-31 ENCOUNTER — Other Ambulatory Visit: Payer: Self-pay | Admitting: *Deleted

## 2022-10-31 DIAGNOSIS — M1711 Unilateral primary osteoarthritis, right knee: Secondary | ICD-10-CM

## 2022-10-31 MED ORDER — MELOXICAM 7.5 MG PO TABS: 7.5000 mg | ORAL_TABLET | Freq: Every day | ORAL | 0 refills | Status: DC

## 2023-09-10 ENCOUNTER — Ambulatory Visit (INDEPENDENT_AMBULATORY_CARE_PROVIDER_SITE_OTHER): Payer: Self-pay | Admitting: Family Medicine

## 2023-09-10 ENCOUNTER — Encounter: Payer: Self-pay | Admitting: Family Medicine

## 2023-09-10 DIAGNOSIS — M1711 Unilateral primary osteoarthritis, right knee: Secondary | ICD-10-CM

## 2023-09-10 MED ORDER — MELOXICAM 7.5 MG PO TABS: 7.5000 mg | ORAL_TABLET | Freq: Every day | ORAL | 1 refills | Status: AC

## 2023-09-10 NOTE — Assessment & Plan Note (Signed)
 Chronic knee pain with knee osteoarthritis, currently stable, does well with intermittent meloxicam  Plan: - Refill for meloxicam 7.5 mg 1 tab p.o. daily as needed given.  Since using intermittently and well-controlled, did give her a 90-day supply - She will continue activity as tolerated - Will follow-up symptoms worsening or not improving.  Could consider possible cortisone or HA injections in the future if symptoms are worsening

## 2023-09-10 NOTE — Progress Notes (Signed)
 DATE OF VISIT: 09/10/2023        Katrina Steele DOB: 1980/10/29 MRN: [id number]  CC: Follow-up knee arthritis  History of present Illness: Katrina Steele is a 43 y.o. female who presents for a follow-up visit for knee osteoarthritis Last seen by Dr. Chick in May 2020 for Has known osteoarthritis of her right knee Has been doing well with some meloxicam  as needed, has run out needs refill Is using meloxicam  intermittently a few times a week Denies any new injury or trauma Currently feeling good.  Medications:  Outpatient Encounter Medications as of 09/10/2023  Medication Sig   estradiol  (ESTRACE ) 1 MG tablet Take 1.5 tablets (1.5 mg total) by mouth daily.   fluconazole  (DIFLUCAN ) 150 MG tablet Take 1 tablet (150 mg total) by mouth once.   meloxicam  (MOBIC ) 7.5 MG tablet Take 1 tablet (7.5 mg total) by mouth daily.   metroNIDAZOLE  (FLAGYL ) 500 MG tablet Take 1 tablet (500 mg total) by mouth 3 (three) times daily.   Multiple Vitamin (ONE-A-DAY ESSENTIAL PO) Take by mouth.   [DISCONTINUED] meloxicam  (MOBIC ) 7.5 MG tablet Take 1 tablet (7.5 mg total) by mouth daily.   No facility-administered encounter medications on file as of 09/10/2023.    Allergies: has no known allergies.  Physical Examination: Vitals: BP 122/86   Ht 5' 5 (1.651 m)   Wt 186 lb (84.4 kg)   BMI 30.95 kg/m  GENERAL:  Katrina Steele is a 43 y.o. female appearing their stated age, alert and oriented x 3, in no apparent distress.  SKIN: no rashes or lesions, skin clean, dry, intact MSK: Bilateral knee with full range of motion without pain, no swelling or effusion.  No ligament laxity.  Negative [place]ray.  Walking without a limp Neurovascular intact distally  Assessment & Plan Primary osteoarthritis of right knee Chronic knee pain with knee osteoarthritis, currently stable, does well with intermittent meloxicam   Plan: - Refill for meloxicam  7.5 mg 1 tab p.o. daily as needed given.  Since  using intermittently and well-controlled, did give her a 90-day supply - She will continue activity as tolerated - Will follow-up symptoms worsening or not improving.  Could consider possible cortisone or HA injections in the future if symptoms are worsening   Patient expressed understanding & agreement with above.  Encounter Diagnosis  Name Primary?   Primary osteoarthritis of right knee     No orders of the defined types were placed in this encounter.
